# Patient Record
Sex: Male | Born: 1960 | Race: Asian | Hispanic: No | Marital: Single | Smoking: Former smoker
Health system: Southern US, Community
[De-identification: ages and names within clinical notes are randomized; demographics above are authoritative.]

---

## 2011-10-24 ENCOUNTER — Emergency Department: Payer: Self-pay | Admitting: Emergency Medicine

## 2012-10-26 LAB — TSH: Thyroid Stimulating Horm: 6.2 u[IU]/mL — ABNORMAL HIGH

## 2012-10-26 LAB — ETHANOL
Ethanol %: 0.429 % (ref 0.000–0.080)
Ethanol: 429 mg/dL

## 2012-10-26 LAB — CBC
HCT: 52.5 % — ABNORMAL HIGH (ref 40.0–52.0)
HGB: 18.2 g/dL — ABNORMAL HIGH (ref 13.0–18.0)
MCH: 36.2 pg — ABNORMAL HIGH (ref 26.0–34.0)
MCV: 104 fL — ABNORMAL HIGH (ref 80–100)
Platelet: 157 10*3/uL (ref 150–440)
RBC: 5.03 10*6/uL (ref 4.40–5.90)
RDW: 13.8 % (ref 11.5–14.5)

## 2012-10-26 LAB — DRUG SCREEN, URINE
Amphetamines, Ur Screen: NEGATIVE (ref ?–1000)
Barbiturates, Ur Screen: NEGATIVE (ref ?–200)
Cannabinoid 50 Ng, Ur ~~LOC~~: POSITIVE (ref ?–50)
Cocaine Metabolite,Ur ~~LOC~~: NEGATIVE (ref ?–300)
MDMA (Ecstasy)Ur Screen: NEGATIVE (ref ?–500)
Phencyclidine (PCP) Ur S: NEGATIVE (ref ?–25)

## 2012-10-26 LAB — COMPREHENSIVE METABOLIC PANEL
Albumin: 4 g/dL (ref 3.4–5.0)
Co2: 25 mmol/L (ref 21–32)
EGFR (Non-African Amer.): >60
SGOT(AST): 343 U/L — ABNORMAL HIGH (ref 15–37)
SGPT (ALT): 165 U/L — ABNORMAL HIGH (ref 12–78)

## 2012-10-26 LAB — ACETAMINOPHEN LEVEL: Acetaminophen: <2 ug/mL

## 2012-10-27 LAB — URINALYSIS, COMPLETE
Bacteria: NONE SEEN
Glucose,UR: NEGATIVE mg/dL (ref 0–75)
Leukocyte Esterase: NEGATIVE
Ph: 6 (ref 4.5–8.0)
RBC,UR: 1 /HPF (ref 0–5)
Specific Gravity: 1.011 (ref 1.003–1.030)
Squamous Epithelial: NONE SEEN
WBC UR: <1 /HPF (ref 0–5)

## 2012-10-28 ENCOUNTER — Inpatient Hospital Stay: Payer: Self-pay | Admitting: Psychiatry

## 2012-10-28 LAB — BEHAVIORAL MEDICINE 1 PANEL
Albumin: 3 g/dL — ABNORMAL LOW (ref 3.4–5.0)
Alkaline Phosphatase: 90 U/L (ref 50–136)
Anion Gap: 3 — ABNORMAL LOW (ref 7–16)
BUN: 14 mg/dL (ref 7–18)
Basophil #: 0.1 10*3/uL (ref 0.0–0.1)
Basophil %: 1.5 %
Bilirubin,Total: 1.4 mg/dL — ABNORMAL HIGH (ref 0.2–1.0)
Calcium, Total: 8.4 mg/dL — ABNORMAL LOW (ref 8.5–10.1)
Chloride: 105 mmol/L (ref 98–107)
Co2: 27 mmol/L (ref 21–32)
Creatinine: 0.88 mg/dL (ref 0.60–1.30)
EGFR (African American): >60
EGFR (Non-African Amer.): >60
Eosinophil #: 0.1 10*3/uL (ref 0.0–0.7)
Eosinophil %: 2.3 %
Glucose: 103 mg/dL — ABNORMAL HIGH (ref 65–99)
HCT: 45.5 % (ref 40.0–52.0)
HGB: 16.1 g/dL (ref 13.0–18.0)
Lymphocyte #: 1.1 10*3/uL (ref 1.0–3.6)
Lymphocyte %: 27.9 %
MCH: 36.7 pg — ABNORMAL HIGH (ref 26.0–34.0)
MCHC: 35.4 g/dL (ref 32.0–36.0)
MCV: 104 fL — ABNORMAL HIGH (ref 80–100)
Monocyte #: 0.3 x10 3/mm (ref 0.2–1.0)
Monocyte %: 9 %
Neutrophil #: 2.3 10*3/uL (ref 1.4–6.5)
Neutrophil %: 59.3 %
Osmolality: 271 (ref 275–301)
Platelet: 101 10*3/uL — ABNORMAL LOW (ref 150–440)
Potassium: 4 mmol/L (ref 3.5–5.1)
RBC: 4.39 10*6/uL — ABNORMAL LOW (ref 4.40–5.90)
RDW: 13.1 % (ref 11.5–14.5)
SGOT(AST): 173 U/L — ABNORMAL HIGH (ref 15–37)
SGPT (ALT): 109 U/L — ABNORMAL HIGH (ref 12–78)
Sodium: 135 mmol/L — ABNORMAL LOW (ref 136–145)
Thyroid Stimulating Horm: 12.3 u[IU]/mL — ABNORMAL HIGH
Total Protein: 7.6 g/dL (ref 6.4–8.2)
WBC: 3.9 10*3/uL (ref 3.8–10.6)

## 2012-10-29 LAB — URINALYSIS, COMPLETE
Bacteria: NONE SEEN
Bilirubin,UR: NEGATIVE
Glucose,UR: NEGATIVE mg/dL (ref 0–75)
Ketone: NEGATIVE
Leukocyte Esterase: NEGATIVE
Nitrite: NEGATIVE
Ph: 6 (ref 4.5–8.0)
Protein: NEGATIVE
RBC,UR: 2 /HPF (ref 0–5)
Specific Gravity: 1.01 (ref 1.003–1.030)
Squamous Epithelial: NONE SEEN
WBC UR: 1 /HPF (ref 0–5)

## 2012-10-29 LAB — TSH: Thyroid Stimulating Horm: 16.6 u[IU]/mL — ABNORMAL HIGH

## 2012-10-30 LAB — T4, FREE: Free Thyroxine: 1.22 ng/dL (ref 0.76–1.46)

## 2012-10-31 LAB — CBC WITH DIFFERENTIAL/PLATELET
HGB: 16.9 g/dL (ref 13.0–18.0)
MCH: 37.1 pg — ABNORMAL HIGH (ref 26.0–34.0)
RDW: 13.2 % (ref 11.5–14.5)
WBC: 7.8 10*3/uL (ref 3.8–10.6)

## 2012-10-31 LAB — SEDIMENTATION RATE: Erythrocyte Sed Rate: 6 mm/hr (ref 0–20)

## 2012-10-31 LAB — HEPATIC FUNCTION PANEL A (ARMC)
Albumin: 3.3 g/dL — ABNORMAL LOW (ref 3.4–5.0)
Bilirubin, Direct: 0.3 mg/dL — ABNORMAL HIGH (ref 0.00–0.20)
SGOT(AST): 156 U/L — ABNORMAL HIGH (ref 15–37)
SGPT (ALT): 115 U/L — ABNORMAL HIGH (ref 12–78)
Total Protein: 8.5 g/dL — ABNORMAL HIGH (ref 6.4–8.2)
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
## 2014-10-05 NOTE — Discharge Summary (Signed)
PATIENT NAME:  Logan HumblesMULLIS, Sire MR#:  [id number] DATE OF BIRTH:  [birth year]  DATE OF ADMISSION:  10/28/2012 DATE OF DISCHARGE:  10/31/2012  HOSPITAL COURSE: See dictated history and physical for details of admission. This 54 year old man was admitted to the hospital because of alcohol intoxication and need for detox. The patient understood that he had been abusing alcohol and was cooperative with detox, but did not want to go to rehab treatment. He insisted it was important that he get back to work as early as possible. The patient was treated with the usual detox protocol and showed no seizures. No signs of delirium. At the time of discharge, he appears to be fully detoxed and able to eat, walk and basically take care of himself. Affect and mood are improved. No suicidality voiced. The patient does have medical issues that have been discovered including an elevated TSH, probably indicating hypothyroidism. He also has some numbness in his fingers of unclear etiology. The patient is agreeable to going to the intensive outpatient program and plans to start that as outpatient treatment at discharge.   DISCHARGE MEDICATIONS: Amlodipine 2.5 mg once a day, pseudoephedrine 120 mg extended-release 1 every 12 hours as needed for nasal congestion, levothyroxine 25 mcg per day, prednisone 10 mg for 2 days, then taper over the next 6 and Aleve 220 mg 2 times a day.   LABORATORY RESULTS: CBC showed a hemoglobin elevated at 8.2, hematocrit elevated at 52.5, MCV elevated at 104. Chemistry panel showed a glucose elevated at 135, ALT elevated at 165, AST elevated at 343, total protein 9.2. Alcohol level on first presentation 429. TSH 6.2. Initially alcohol level gradually decreased as would be expected. Urinalysis unremarkable. Drug screen positive for cannabis. Followup TSH actually showed an increase to 16.6, triggering medicine consult. Free thyroxine in the normal range at 1.2. Free T3 in the normal range at 3.2.  Sedimentation rate normal.   MENTAL STATUS EXAM AT DISCHARGE: Casually dressed, neatly groomed man who looks his stated age. Cooperative with the interview. Good eye contact, normal psychomotor activity. Speech normal rate, tone and volume. Affect euthymic, reactive, appropriate. Mood stated as okay. Thoughts appear lucid. No evidence of loosening of associations or delusions. Denies auditory or visual hallucinations. Denies suicidal or homicidal ideation. Shows improved judgment and insight. Normal intelligence.   DIAGNOSIS, PRINCIPAL AND PRIMARY:  AXIS I:  1.  Alcohol dependence.  2.  Substance-induced mood disorder, resolving.  AXIS II: Deferred.  AXIS III: Hypertension, numbness in the fingers of unclear etiology, new onset or newly discovered hypothyroid.  AXIS IV: Severe from substance abuse.  AXIS V: Functioning at time of discharge 54.    ____________________________ Audery AmelJohn T. Clapacs, MD jtc:aw D: 10/31/2012 23:51:16 ET T: 11/01/2012 07:03:00 ET JOB#: [id number]  cc: Audery AmelJohn T. Clapacs, MD, <Dictator> Audery AmelJOHN T CLAPACS MD ELECTRONICALLY SIGNED 11/01/2012 9:37

## 2014-10-05 NOTE — H&P (Signed)
PATIENT NAME:  Logan Irwin, BURGGRAF MR#:  [id number] DATE OF BIRTH:  [birth year]  DATE OF ADMISSION:  10/28/2012  IDENTIFYING INFORMATION AND CHIEF COMPLAINT: This is a 54 year old man who presented to the Emergency Room on referral from [facility] for detox. The patient's chief complaint: "Getting detox."   HISTORY OF PRESENT ILLNESS: The patient presented to [facility] wanting to get off the alcohol that he has been drinking. He has been drinking about a pint of vodka and a large amount of Mike's Lemonade on a daily basis. He has been having some difficulty at work, but for the most part has been able to maintain his functioning. He is motivated, however, by feeling bad about his general behavior at home and feeling that the alcohol is damaging his health. The patient says that his mood is feeling somewhat anxious and depressed. Denies suicidal ideation. Denies psychotic symptoms. The patient does have a history of withdrawal symptoms including shakiness. He does not know of any history of seizures or delirium.   PAST PSYCHIATRIC HISTORY: The patient has had extended periods of sobriety in the past, but not since 1997. He does not have any history of suicide attempts. Denies that he has any history of other psychiatric treatment. He has been to [facility] in the past.   FAMILY HISTORY: Positive for substance abuse.   PAST MEDICAL HISTORY: The patient has arthritis, otherwise no significant ongoing medical problems.   SOCIAL HISTORY: The patient lives with his 11 year old daughter. He is divorced. He works as a Corporate investment banker.   MEDICATIONS: None.   ALLERGIES: No known drug allergies.   REVIEW OF SYSTEMS: The patient complains of feeling somewhat shaky and a little bit sick to his stomach. Denies feeling depressed. Denies suicidal or homicidal ideation. Denies hallucinations or delusions.   MENTAL STATUS EXAMINATION: The patient was asleep but easy to wake up and was passively cooperative. Eye contact good.  Psychomotor activity restrained. He has a bit of a tremor when he does move and is a little bit unsteady on his feet. Speech decreased in total amount. Affect flat. Mood stated as being fine. Thoughts are slow, a little bit confused but not bizarre. No evidence of hallucinations. Denies auditory or visual hallucinations. Denies suicidal or homicidal ideation. Alert and oriented. Probably of average intelligence.   PHYSICAL EXAMINATION: GENERAL: The patient is unsteady on his feet, also has a general tremor.  SKIN: Multiple tattoos and scars on his face but no acute skin lesions.  HEENT: Pupils equal and reactive. Face symmetric.  NEUROMUSCULAR: Strength and reflexes normal throughout. Full range of motion at extremities. Cranial nerves symmetric and normal.  LUNGS: Clear without wheezes.  HEART: Regular rate and rhythm.  ABDOMEN: Soft, nontender, normal bowel sounds.  VITAL SIGNS: Blood pressure currently 139/90, pulse 62, temperature 98, respirations 20.   LABORATORY RESULTS: On admission to the Emergency Room, he had multiple abnormalities. CBC showed  elevated hematocrit at 52.5, elevated hemoglobin at 18.2. Glucose elevated at 135. ALT was 165, AST 343, total protein 9.2. Alcohol on first presentation was 429 TSH elevated at 6.2. Urinalysis not infected. Drug screen positive for cannabis. Followup CBC showed a normalization of his hemoglobin and hematocrit as he became better hydrated. Now he has a low platelet count of 101. TSH is now elevated at 12.3.   ASSESSMENT: This is a 54 year old man with alcohol dependence. Presents very intoxicated requiring alcohol withdrawal. Currently he is shaky, slightly confused, having some instability in his blood pressure and pulse. He is  requesting to be discharged from the hospital by Saturday or  Sunday. This appears to be unrealistic.   TREATMENT PLAN: Continue detox. Supportive and educational therapy done. Let him know that I did not think he would be  safely detoxed in just another day. Engage him when he can wake up in groups on the unit with education about substance abuse treatment. Try and work on possible referral to the intensive outpatient program, which is something he is asking about.   DIAGNOSIS, PRINCIPAL AND PRIMARY:  AXIS I: Alcohol dependence.   SECONDARY DIAGNOSES: AXIS I:  1.  Cannabis abuse.  2.  Substance-induced mood disorder.  AXIS II: Deferred.  AXIS III: History of multiple old injuries. No acute new injuries. 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  AXIS IV: Moderate to severe from his chronic alcohol use and isolation.  AXIS V: Functioning at time of evaluation: 39.   ____________________________ Audery AmelJohn T. Treson Laura, MD jtc:jm D: 10/28/2012 23:20:17 ET T: 10/28/2012 23:31:46 ET JOB#: [id number]  cc: Audery AmelJohn T. Hortense Cantrall, MD, <Dictator> Audery AmelJOHN T Erbie Arment MD ELECTRONICALLY SIGNED 10/29/2012 8:46

## 2014-10-05 NOTE — Consult Note (Signed)
PATIENT NAME:  Logan Irwin, Logan Irwin MR#:  [id number] DATE OF BIRTH:  13-Oct-1960  CONSULTATION H AND P  DATE OF CONSULTATION:  10/30/2012  PRIMARY CARE PHYSICIAN: At [facility].  CONSULT REQUESTED BY: Dr. Nicolasa Ducking.   The patient is on Dr. Marletta Lor service.   REASON FOR CONSULTATION: Pain and numbness in the fingers and wrists.   HISTORY OF PRESENT ILLNESS: This is a 54 year old man who was admitted to the psychiatry floor on 10/27/2012. He was admitted with alcohol intoxication. He was brought in by his family concerned about his behavior, and he was interested in detox at that time.   On the evening of May 17th going into the morning of May 18th he stated he was going downhill on his right hand. He cannot make his fist. All of his fingers are hard to bend. Some right wrist discomfort on his left hand. He has third, fourth and fifth fingers hard to make a fist and some deep pain in those fingers. The patient has been taking Aleve on a daily basis at home. He does work in Architect. Here in the hospital Aleve was not prescribed for him. The patient was also found to have very elevated blood pressure and an elevated TSH, and hospitalist services were contacted for further evaluation.   The patient states that he has not had problems in his hands in the past. He usually has pain in the knee and some various other pains, which he does take the Aleve for.   PAST MEDICAL HISTORY: Gastroesophageal reflux disease, chronic knee pain.   PAST SURGICAL HISTORY: Left shoulder surgeries x 2, bilateral knee operations.   ALLERGIES: No known drug allergies.   MEDICATIONS: In the hospital include thiamine 50 mg for 3 days, Sudafed 12-Hour p.r.n. nasal congestion, Ativan orally or IM via CIWA protocol, promethazine p.r.n., nausea, vomiting; Phenergan suppository p.r.n. nausea, vomiting; multivitamin daily, magnesium oxide 400 mg daily, Haldol 2 mg orally q.4 hours p.r.n. hallucinations, paranoia;  folic acid 1 mg daily,  clonidine 0.1 mg orally q.3 h. p.r.n., hypertension or withdrawal symptoms, Maalox p.r.n.   Medications at home include Prevacid daily, and Aleve daily.   SOCIAL HISTORY: No smoking. He does drink a pint of vodka per day. Positive for marijuana use. Works in Architect.   FAMILY HISTORY: Father died of probably lung cancer and also had thyroid cancer. Mother has joint pains, but that does not know the etiology of that.   REVIEW OF SYSTEMS:  CONSTITUTIONAL: No fever, chills, or sweats. Positive for weight gain over the past few years, 20 pounds. No fatigue.  EYES: He does wear glasses.  EARS, NOSE, MOUTH, AND THROAT: He did have a burst eardrum in the past.  CARDIOVASCULAR: No chest pain. Occasional palpitations.  RESPIRATORY: No shortness of breath. No cough. No sputum. No hemoptysis.  GASTROINTESTINAL: Positive for dry heaving with alcohol. Positive for diarrhea with alcohol. No abdominal pain. No bright red blood per rectum. No melena.  GENITOURINARY: No burning on urination. No hematuria.  MUSCULOSKELETAL: Positive for knee pain and back pain.  INTEGUMENT: No rashes or eruptions.  NEUROLOGICAL: No fainting or blackouts.  PSYCHIATRIC: No anxiety or depression.  ENDOCRINE: No thyroid problems that he knew of.  HEMATOLOGIC/LYMPHATIC: No anemia.   PHYSICAL EXAMINATION: VITAL SIGNS: Temperature 98, pulse 76, respirations 20, blood pressure 176/111.  GENERAL: No respiratory distress.  EYES: Conjunctivae and lids normal. Pupils equal, round, and reactive to light. Extraocular muscles intact. No nystagmus.  EARS, NOSE, MOUTH, AND  THROAT: Nasal mucosa: No erythema.  THROAT: No erythema. No exudate seen.  LIPS AND GUMS: No lesions.  NECK: No JVD. No bruits. No lymphadenopathy. No thyromegaly. No thyroid nodules palpated.  LUNGS: Clear to auscultation. No use of accessory muscles to breathe. No rhonchi, rales, or wheeze heard.  CARDIOVASCULAR SYSTEM: S1, S2 normal.  No gallops, rubs or murmurs heard. Carotid upstrokes 2+ bilaterally. No bruits.  EXTREMITIES: Dorsalis pedis pulses 2+ bilaterally. No edema of the lower extremity.  ABDOMEN: Soft, nontender. No organosplenomegaly. Normoactive bowel sounds. No masses felt.  LYMPHATIC: No lymph nodes in the neck.  MUSCULOSKELETAL: No clubbing, edema or cyanosis.  SKIN: No rashes or ulcers seen.  NEUROLOGIC: Cranial nerves II-XII grossly intact. Deep tendon reflexes 2+, bilateral lower extremities. Tinel's sign is negative. Phalen's sign negative.  PSYCHIATRIC: The patient is oriented to person, place and time.  HAND: The patient does have some pain over the joints in the left hand over the MP joints in the third, fourth and fifth MP joints, with difficulty squeezing his hand. On the right hand difficulty squeezing the hand. When he does squeeze the hand he does have pain on the palmar side of the wrist.   LABORATORY AND RADIOLOGICAL DATA: URINALYSIS: 2+ blood. TSH 16.6.   White blood cell count 3.9, H and H  16.1 and 45.5, platelet count of 101.   Glucose 103, BUN 14, creatinine 0.88, sodium 135, potassium 4.0, chloride 105, CO2 27, calcium 8.4, total bilirubin 1.4, alkaline phosphatase 90, AST 173.   Urine toxicology positive for cannabis. Ethanol level when he presented was 429. Acetaminophen and salicylates are negative.   ASSESSMENT AND PLAN: 1.  Joint pain in the hands, with some numbness: He has not been taking his usual Aleve. I will give stat dose of prednisone 20 mg stat, then 10 mg p.o. daily and reevaluate tomorrow for further symptoms. I will send off an ANA to rule out autoimmune diseases, ESR to see if this is inflammatory; rheumatoid factor to see if this is rheumatoid arthritis, a CBC with differential to rule out eosinophilic fasciitis. I think this is probably brought on because he has not been taking his usual Aleve.  2.  Malignant hypertension: Monitor blood pressure closely. The patient was  given a stat dose of Norvasc 2.5 mg p.o. daily. Blood pressure did come down to 136/91. I will continue that for right now. This could also be precipitated through the alcohol withdrawal process. The patient does not look like he is in alcohol withdrawal. Discussed low-salt diet.  3.  Hypothyroidism: Will start low-dose Synthroid. Recheck TFTs in 6 weeks. Will send off a free T4 and a free T3.  4.  Alcohol abuse: The patient is the CIWA protocol with psychiatry following.  5.  Increased liver function tests and thrombocytopenia, likely secondary to alcohol abuse: Recheck in 6 weeks after stopping alcohol.  6.  Increased MCV, likely alcohol-related.   Time spent on consultation: Fifty minutes.   ____________________________ Tana Conch. Leslye Peer, MD rjw:dm D: 10/30/2012 14:36:46 ET T: 10/30/2012 17:04:26 ET JOB#: [id number]  cc: Tana Conch. Leslye Peer, MD, <Dictator> [facility] Aarti K. Nicolasa Ducking, MD Gonzella Lex, MD   Marisue Brooklyn MD ELECTRONICALLY SIGNED 11/04/2012 13:29

## 2014-10-05 NOTE — Consult Note (Signed)
PATIENT NAME:  Logan Irwin, KUMARI MR#:  [id number] DATE OF BIRTH:  May 25, 1961  PSYCHIATRY CONSULTATION REPORT  DATE OF CONSULTATION:  10/27/2012  CONSULTING PHYSICIAN:  Brodrick Curran S. Garnetta Buddy, MD  REQUESTING PHYSICIAN:  Daryel November, MD  REASON FOR CONSULTATION: Alcohol intoxication.   HISTORY OF PRESENT ILLNESS: The patient is a 54 year old male who presented intoxicated, with a blood alcohol level of 304 from [facility] for medical clearance. He initially presented with slurred speech during the interview. He has a 15-year history of alcoholism, and presented intoxicated, and was drinking a pint of vodka and a lot of Mike's Lemonade on a daily basis.   The patient had been sober for 18 years in the past. He reported that he relapsed because he wanted to. The patient was initially uncooperative and was unable to provide a good history. However, during my interview the patient reported that he was brought into the hospital by his 16 year old daughter who was very much concerned about his behavior as he has started drinking a pint of vodka on a daily basis. He reported that the last time he detoxed was in 1997. Since then, he started drinking a lot and has been having issues at his work as he is currently involved in the Holiday representative business. He reported that he drinks in the evening and he starts having withdrawal symptoms, including shakes and blackouts, in the morning. He reported that he has to drink more to control his symptoms. He reported that he currently lives by himself. The patient reported that he has been having issues with his drinking, so his daughter, who is currently involved with his care, decided to bring him to the hospital. The patient stated that he wants help and he is interested in getting admission for his drinking. He also admits to feeling depressed, down, with low energy, anhedonia, and having problems with his sleep. He also reported that he has a DWI charge pending and his court date is on  May 29th. The patient stated that he does not have any thoughts to harm himself at this time. He admits to having frequent blackouts.   PAST PSYCHIATRIC HISTORY: The patient reported that he does not have any history of suicide attempts. He has a history of detox twice in 1999. He was admitted to [facility] at that time. He also has been admitted to a placement in [place]. The patient reported that he has been involved in legal charges, including a DWI related to his drinking heavily.   FAMILY HISTORY: The patient reported that he has a history of alcoholism in his family on both sides. He denied any history of suicide in his family. The patient has history of DWIs since 1996.   ALLERGIES: No known drug allergies.   CURRENT MEDICATIONS: Aleve 220 mg daily, Prevacid 30 mg daily.   MEDICAL ISSUES: Arthritis in the joints.   SOCIAL HISTORY: The patient currently lives by himself. He is currently divorced. He has a 66 year old daughter who takes care of him. He has pending legal charges.   VITAL SIGNS: Temperature 98.2, pulse 73, respirations 18, blood pressure 182/115, glucose 135, BUN 8, creatinine 0.81, sodium 137, potassium 3.8, chloride 106, bicarbonate 25, anion gap 6, osmolality 274.   Blood alcohol level of 429 at the time of admission, and now it is 222. Protein 9.2, albumin  4.0, bilirubin 0.6, alkaline phosphatase 128, AST 343, ALT 165.   TSH 6.20.   Urine drug screen positive for cannabinoids.   WBC 7.6, RBC 5.03, hemoglobin  18.2, hematocrit 52.5, platelet count 157, MCV 104, MCH 36.2, MCH 34.7.   REVIEW OF SYSTEMS:  CONSTITUTIONAL: Denies any fever, but has fatigue and weakness.  EYES: Denies any doubled or blurred vision.  ENT: Denies any tinnitus or ear pain, or hearing loss.  RESPIRATORY: No cough, wheezing or hemoptysis noticed.  CARDIOVASCULAR: No chest pain, orthopnea, or edema noted.  GASTROINTESTINAL: No nausea, vomiting, diarrhea, abdominal pain noted.  GENITOURINARY:  No dysuria or hematuria noted.  ENDOCRINE: No polydipsia, nocturia noted.  INTEGUMENTARY: No acne or rash noted. Has knee pains.  NEUROLOGICAL: No numbness, weakness or tingling noted.   MENTAL STATUS EXAMINATION: The patient is a moderately-built male who was lying in the bed. He was calm and cooperative. He maintained good eye contact. His speech was low in tone and volume. Mood was depressed and anxious. Affect was congruent. Thought process was logical, goal-directed. Thought content was nondelusional. He does not have any perceptual disturbances. He demonstrated fair insight and judgment about his use of alcohol and his seeking treatment at this time. He does not have any control over his impulsive use of alcohol. His memory appeared intact at this time. He denied having any suicidal or homicidal ideations or plans.   DIAGNOSTIC IMPRESSIONS: AXIS I: 1.  Alcohol dependence.  2.  Alcohol-induced mood disorder.  3.  Cannabis abuse.   AXIS II: None.   AXIS III: Arthritis; gastroesophageal reflux disease.   AXIS IV: Severe.   AXIS V: Current Global Assessment of Functioning: 25.   TREATMENT PLAN: 1.  The patient will be admitted to the inpatient behavioral health unit.  2.  He will be started on the CIWA protocol and I will also add lorazepam 2 mg p.o. t.i.d. on a regular basis to prevent withdrawal from alcohol.  3.  Discuss with the patient about inpatient treatment, and he agreed with the plan. He will be referred to the rehabilitation once he becomes stable, and he agreed with the same. He will attend group and milieu therapy in the inpatient unit. The treatment team to follow.   Thank you for allowing me to participate in the care of this patient.   ____________________________ Ardeen FillersUzma S. Garnetta BuddyFaheem, MD usf:dm D: 10/27/2012 12:02:33 ET T: 10/27/2012 13:04:39 ET JOB#: [id number]  cc: Ardeen FillersUzma S. Garnetta BuddyFaheem, MD, <Dictator> Rhunette CroftUZMA S Evelene Roussin MD ELECTRONICALLY SIGNED 11/01/2012 16:39

## 2017-10-29 ENCOUNTER — Ambulatory Visit: Payer: Self-pay | Admitting: Podiatry

## 2017-10-29 ENCOUNTER — Ambulatory Visit: Payer: Self-pay

## 2017-10-29 ENCOUNTER — Encounter: Payer: Self-pay | Admitting: Podiatry

## 2017-10-29 ENCOUNTER — Encounter

## 2017-11-16 ENCOUNTER — Ambulatory Visit: Payer: No Typology Code available for payment source

## 2017-11-16 ENCOUNTER — Ambulatory Visit (INDEPENDENT_AMBULATORY_CARE_PROVIDER_SITE_OTHER): Payer: No Typology Code available for payment source | Admitting: Podiatry

## 2017-11-16 ENCOUNTER — Encounter: Payer: Self-pay | Admitting: Podiatry

## 2017-11-16 ENCOUNTER — Encounter

## 2017-11-16 ENCOUNTER — Ambulatory Visit (INDEPENDENT_AMBULATORY_CARE_PROVIDER_SITE_OTHER): Payer: No Typology Code available for payment source

## 2017-11-16 DIAGNOSIS — M779 Enthesopathy, unspecified: Secondary | ICD-10-CM

## 2017-11-16 DIAGNOSIS — M205X1 Other deformities of toe(s) (acquired), right foot: Secondary | ICD-10-CM

## 2017-11-18 NOTE — Progress Notes (Signed)
   HPI: 57 year old male presenting today as a new patient with a chief complaint of pain to the bilateral feet, left worse than right, that began 3 months ago. Walking for extended periods of time increases the pain. She has been taking Ibuprofen for treatment. [facility] hallux limitus of bilateral 1st MPJs. Patient is here for further evaluation and treatment.   No past medical history on file.   Physical Exam: General: The patient is alert and oriented x3 in no acute distress.  Dermatology: Skin is warm, dry and supple bilateral lower extremities. Negative for open lesions or macerations.  Vascular: Palpable pedal pulses bilaterally. No edema or erythema noted. Capillary refill within normal limits.  Neurological: Epicritic and protective threshold grossly intact bilaterally.   Musculoskeletal Exam: Range of motion within normal limits to all pedal and ankle joints bilateral. Muscle strength 5/5 in all groups bilateral.   Radiographic Exam:  Normal osseous mineralization. Joint spaces preserved. No fracture/dislocation/boney destruction.    Assessment: 1. Acute gout attack left midfoot - resolved   Plan of Care:  1. Patient evaluated. X-Rays reviewed.  2. Recommended OTC Motrin as needed.  3. Recommended good supportive shoes.  4. Return to clinic as needed.   Works Holiday representativeconstruction.      Felecia ShellingBrent M. Evans, DPM [facility]  Dr. Felecia ShellingBrent M. Evans, DPM    [address]                                        [place], [place] [id number]                Office [phone]  Fax [phone]

## 2017-11-21 NOTE — Progress Notes (Signed)
This encounter was created in error - please disregard.

## 2017-12-02 ENCOUNTER — Telehealth: Payer: Self-pay | Admitting: Podiatry

## 2017-12-02 NOTE — Telephone Encounter (Signed)
[facility] needs to have notes for 6/4 faxed to [phone].
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
## 2023-12-06 DIAGNOSIS — M549 Dorsalgia, unspecified: Secondary | ICD-10-CM

## 2023-12-07 ENCOUNTER — Other Ambulatory Visit: Payer: Self-pay | Admitting: Nurse Practitioner

## 2023-12-07 DIAGNOSIS — M545 Low back pain, unspecified: Secondary | ICD-10-CM

## 2023-12-08 ENCOUNTER — Ambulatory Visit
Admission: RE | Admit: 2023-12-08 | Discharge: 2023-12-08 | Disposition: A | Discharge status: Home / Self Care | Source: Ambulatory Visit | Attending: Nurse Practitioner | Admitting: Nurse Practitioner

## 2023-12-08 DIAGNOSIS — M545 Low back pain, unspecified: Secondary | ICD-10-CM | POA: Insufficient documentation

## 2023-12-08 MED ORDER — GADOBUTROL 1 MMOL/ML IV SOLN
7.5000 mL | Freq: Once | INTRAVENOUS | Status: AC | PRN
  Administered 2023-12-08: 7.5 mL via INTRAVENOUS

## 2023-12-22 ENCOUNTER — Other Ambulatory Visit: Payer: Self-pay

## 2023-12-22 DIAGNOSIS — C450 Mesothelioma of pleura: Secondary | ICD-10-CM

## 2023-12-29 ENCOUNTER — Ambulatory Visit
Admission: RE | Admit: 2023-12-29 | Discharge: 2023-12-29 | Disposition: A | Discharge status: Home / Self Care | Source: Ambulatory Visit | Attending: Pathology | Admitting: Pathology

## 2023-12-29 VITALS — Ht 71.0 in | Wt 175.0 lb

## 2023-12-29 DIAGNOSIS — R1901 Right upper quadrant abdominal swelling, mass and lump: Secondary | ICD-10-CM | POA: Insufficient documentation

## 2023-12-29 DIAGNOSIS — C450 Mesothelioma of pleura: Secondary | ICD-10-CM | POA: Insufficient documentation

## 2023-12-29 LAB — GLUCOSE, CAPILLARY: Glucose-Capillary: 91 mg/dL (ref 70–99)

## 2023-12-29 MED ORDER — FLUDEOXYGLUCOSE F - 18 (FDG) INJECTION
9.1000 | Freq: Once | INTRAVENOUS | Status: AC | PRN
  Administered 2023-12-29: 9.68 via INTRAVENOUS
